# Patient Record
Sex: Female | Born: 1970 | Race: Black or African American | Hispanic: No | Marital: Single | Smoking: Never smoker
Health system: Southern US, Community
[De-identification: ages and names within clinical notes are randomized; demographics above are authoritative.]

## PROBLEM LIST (undated history)

## (undated) DIAGNOSIS — K5900 Constipation, unspecified: Secondary | ICD-10-CM

## (undated) DIAGNOSIS — E782 Mixed hyperlipidemia: Secondary | ICD-10-CM

## (undated) DIAGNOSIS — K429 Umbilical hernia without obstruction or gangrene: Secondary | ICD-10-CM

## (undated) DIAGNOSIS — D649 Anemia, unspecified: Secondary | ICD-10-CM

## (undated) DIAGNOSIS — R1900 Intra-abdominal and pelvic swelling, mass and lump, unspecified site: Secondary | ICD-10-CM

## (undated) DIAGNOSIS — R109 Unspecified abdominal pain: Secondary | ICD-10-CM

## (undated) HISTORY — DX: Constipation, unspecified: K59.00

## (undated) HISTORY — DX: Unspecified abdominal pain: R10.9

## (undated) HISTORY — DX: Anemia, unspecified: D64.9

## (undated) HISTORY — DX: Mixed hyperlipidemia: E78.2

## (undated) HISTORY — DX: Intra-abdominal and pelvic swelling, mass and lump, unspecified site: R19.00

## (undated) HISTORY — DX: Umbilical hernia without obstruction or gangrene: K42.9

---
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
## 1999-09-02 ENCOUNTER — Other Ambulatory Visit: Admission: RE | Admit: 1999-09-02 | Discharge: 1999-09-02 | Payer: Self-pay | Admitting: Obstetrics and Gynecology

## 1999-09-03 ENCOUNTER — Ambulatory Visit (HOSPITAL_COMMUNITY): Admission: RE | Admit: 1999-09-03 | Discharge: 1999-09-03 | Payer: Self-pay | Admitting: Obstetrics and Gynecology

## 1999-09-03 ENCOUNTER — Encounter: Payer: Self-pay | Admitting: Obstetrics and Gynecology

## 2000-06-15 HISTORY — PX: MYOMECTOMY ABDOMINAL APPROACH: SUR870

## 2000-11-10 ENCOUNTER — Other Ambulatory Visit: Admission: RE | Admit: 2000-11-10 | Discharge: 2000-11-10 | Payer: Self-pay | Admitting: Obstetrics and Gynecology

## 2000-12-22 ENCOUNTER — Inpatient Hospital Stay (HOSPITAL_COMMUNITY): Admission: RE | Admit: 2000-12-22 | Discharge: 2000-12-25 | Payer: Self-pay | Admitting: Obstetrics and Gynecology

## 2000-12-22 ENCOUNTER — Encounter (INDEPENDENT_AMBULATORY_CARE_PROVIDER_SITE_OTHER): Payer: Self-pay | Admitting: Specialist

## 2005-03-17 ENCOUNTER — Other Ambulatory Visit: Admission: RE | Admit: 2005-03-17 | Discharge: 2005-03-17 | Payer: Self-pay | Admitting: Obstetrics and Gynecology

## 2007-03-29 ENCOUNTER — Ambulatory Visit: Payer: Self-pay | Admitting: Cardiology

## 2007-04-05 ENCOUNTER — Encounter: Payer: Self-pay | Admitting: Cardiology

## 2007-04-05 ENCOUNTER — Ambulatory Visit: Payer: Self-pay

## 2007-05-31 ENCOUNTER — Inpatient Hospital Stay (HOSPITAL_COMMUNITY): Admission: RE | Admit: 2007-05-31 | Discharge: 2007-06-04 | Payer: Self-pay | Admitting: Obstetrics and Gynecology

## 2007-05-31 ENCOUNTER — Encounter (INDEPENDENT_AMBULATORY_CARE_PROVIDER_SITE_OTHER): Payer: Self-pay | Admitting: Obstetrics and Gynecology

## 2007-12-06 ENCOUNTER — Ambulatory Visit (HOSPITAL_COMMUNITY): Admission: RE | Admit: 2007-12-06 | Discharge: 2007-12-06 | Payer: Self-pay | Admitting: Obstetrics and Gynecology

## 2008-11-06 IMAGING — US US ABDOMEN COMPLETE
1 series · 14 of 25 positions shown · non-contrast
Comparison: No priors

CLINICAL DATA: Abdominal pain

ABDOMEN ULTRASOUND
TECHNIQUE: Complete abdominal ultrasound examination was performed
including evaluation of the liver, gallbladder, bile ducts,
pancreas, kidneys, spleen, IVC, and abdominal aorta.

[Series 1: unknown · 0.30mm/px · 14 of 59 slices shown]
[im 1/59]
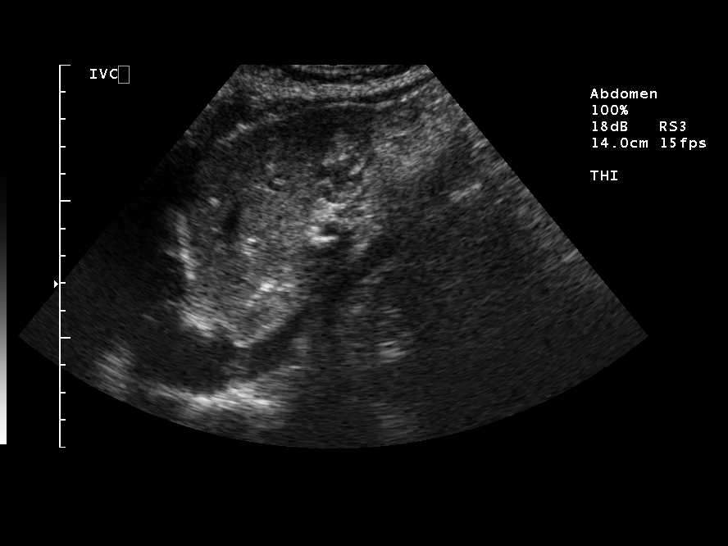
[im 5/59]
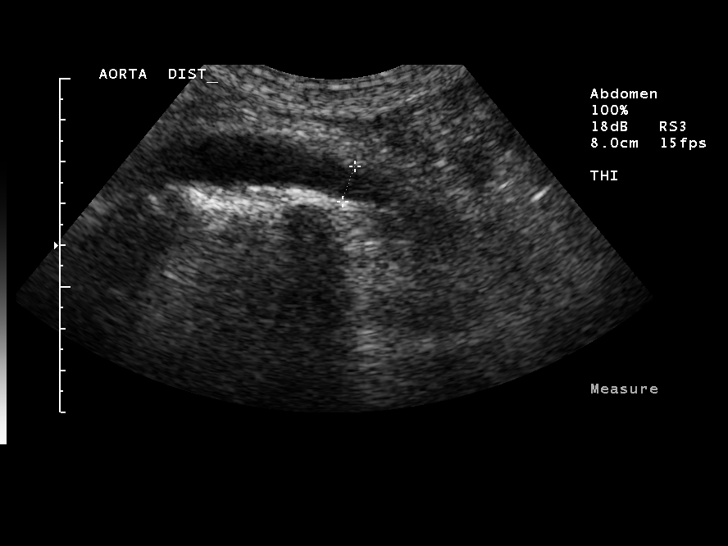
[im 10/59]
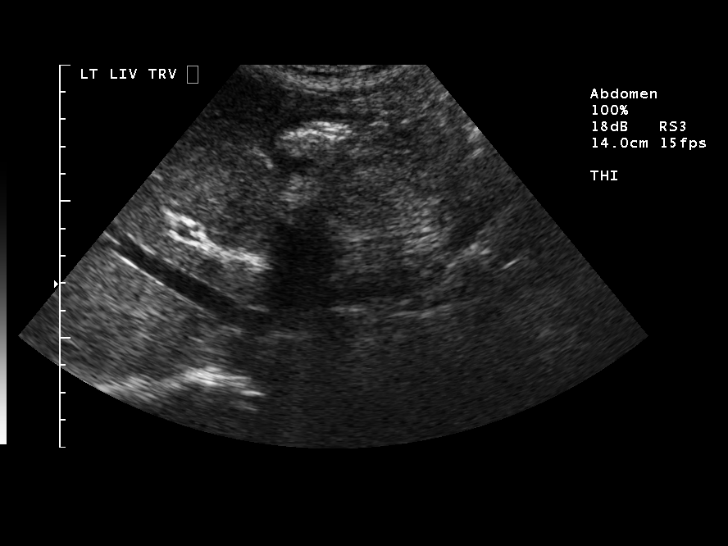
[im 15/59]
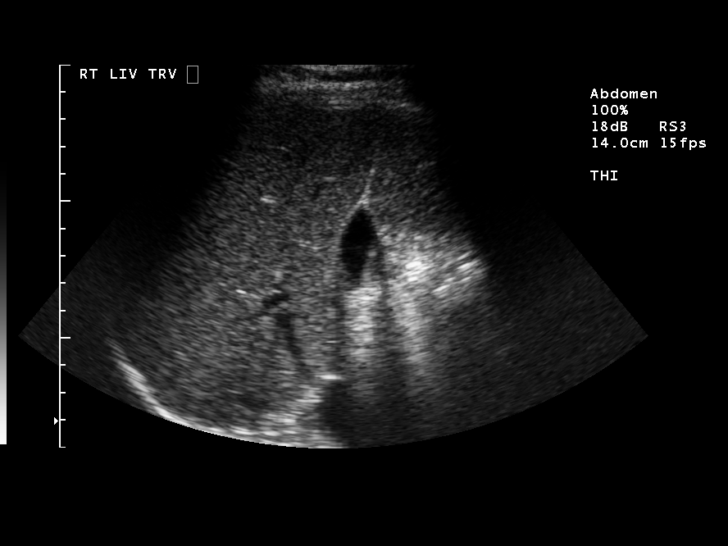
[im 20/59]
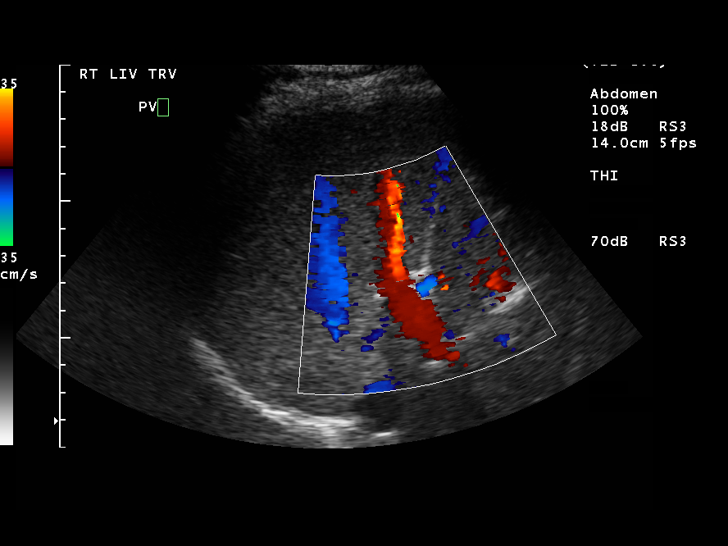
[im 22/59]
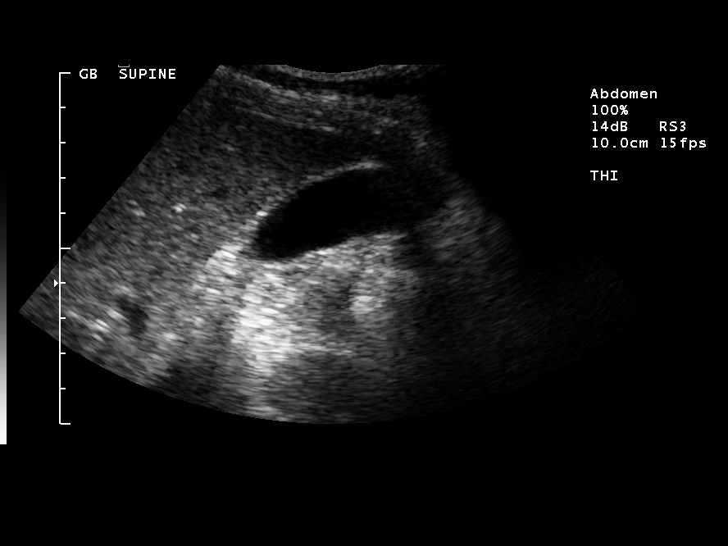
[im 27/59]
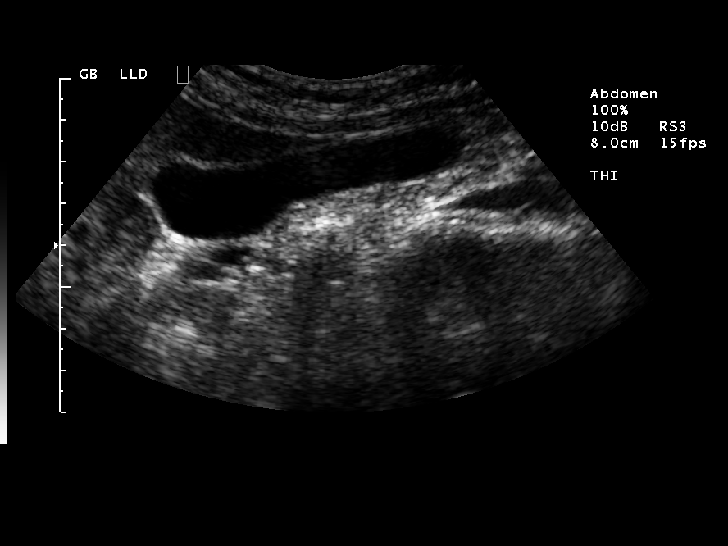
[im 32/59]
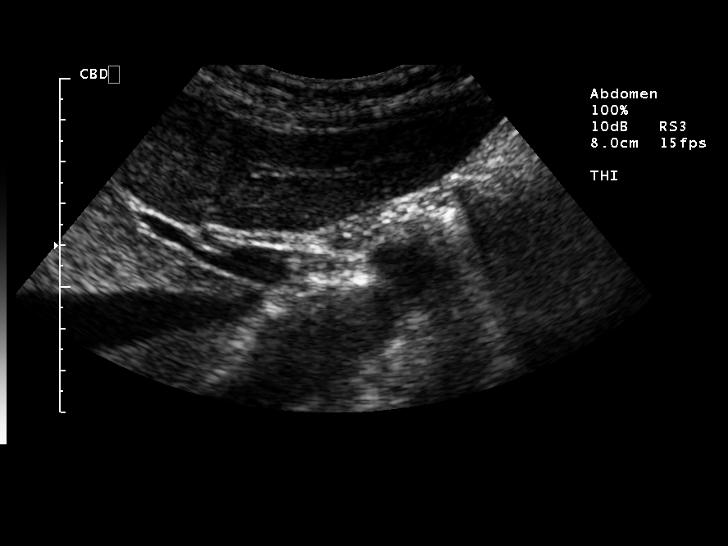
[im 37/59]
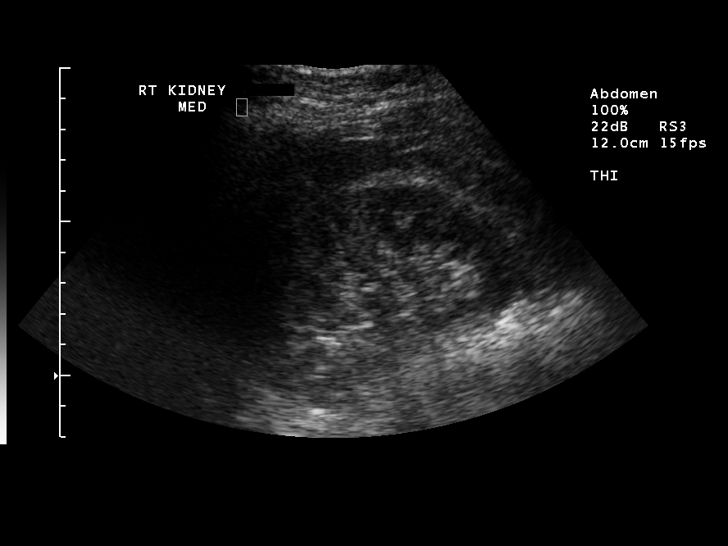
[im 39/59]
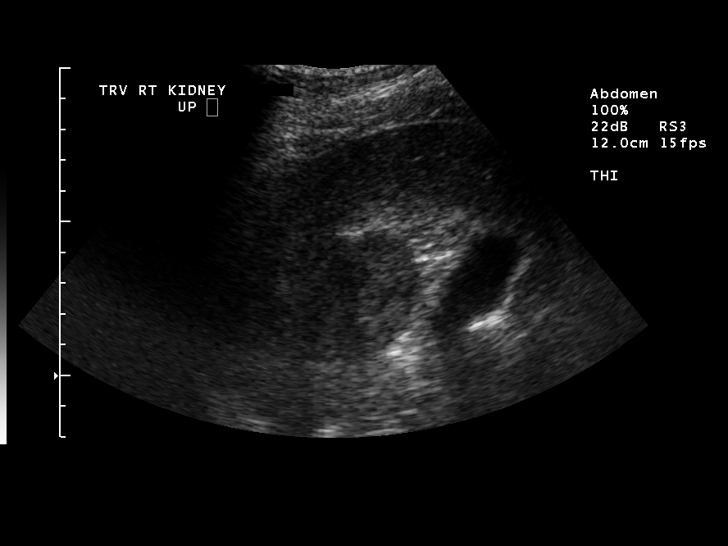
[im 44/59]
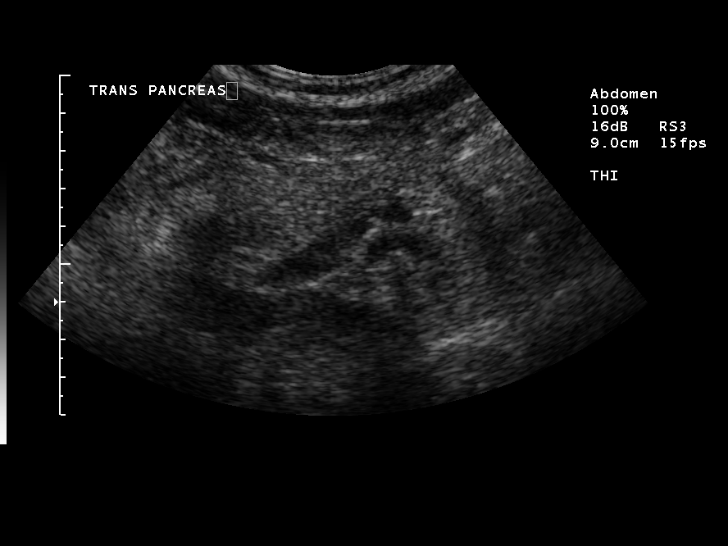
[im 49/59]
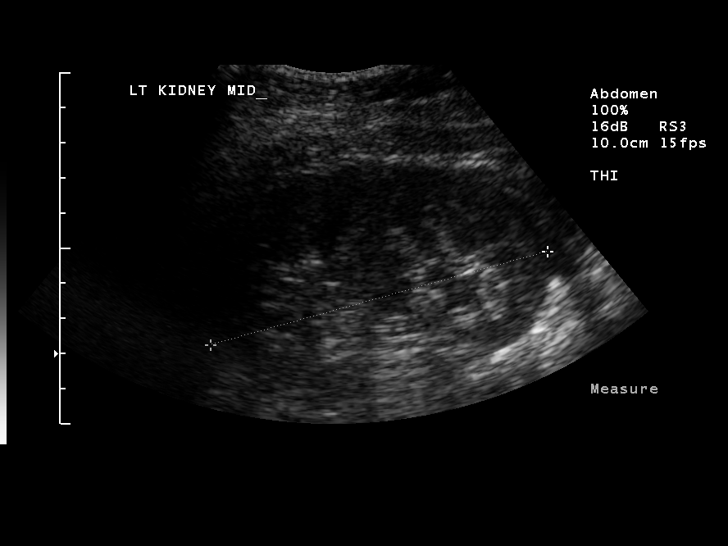
[im 54/59]
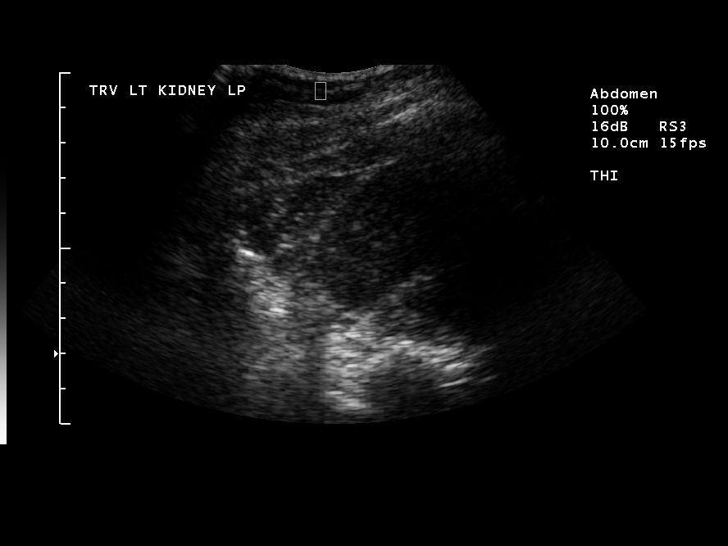
[im 59/59]
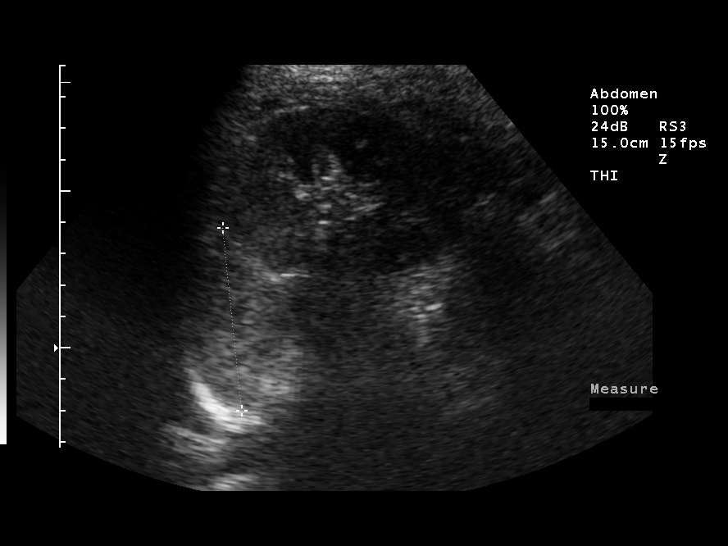

[14 of 25 positions shown; findings below may reference images not displayed]

FINDINGS: Gallbladder and bile ducts normal.  The common duct is
measured at 2.4 mm.

No focal lesions of the liver or spleen..  The spleen is relatively
small measuring only about 6 cm and CC dimension.

Kidneys, aorta, IVC, and pancreas normal.  No ascites.
IMPRESSION: 1.  No acute findings.
2.  The spleen is relatively small.

## 2009-12-08 ENCOUNTER — Emergency Department (HOSPITAL_COMMUNITY): Admission: EM | Admit: 2009-12-08 | Discharge: 2009-12-08 | Payer: Self-pay | Admitting: Emergency Medicine

## 2010-06-15 HISTORY — PX: HERNIA REPAIR: SHX51

## 2010-10-28 NOTE — Assessment & Plan Note (Signed)
[facility]                            CARDIOLOGY OFFICE NOTE   JALEIYAH, ALAS               MRN:          [id number]  DATE:03/29/2007                            DOB:          [birth year]    CHIEF COMPLAINT:  Almost blacking out spells.   HISTORY OF PRESENT ILLNESS:  Katelyn Lucas is a 40 year old single  black female who is currently about [redacted] weeks pregnant. She has had  several episode, one or two while driving [place] [address] to work, where she  has become pre-syncopal. These spells were highlighted by feeling  lightheaded, seeing spots and getting hot. She has not fainted  completely.   This has also happened on several occasions just sitting around the  house. She also notes that she gets lightheaded when she stands up too  quickly.   She has no history of syncope as a child or young adult.   PAST MEDICAL HISTORY:  She does not smoke, drink, use recreational  drugs.   ALLERGIES:  SULFA.   CURRENT MEDICATIONS:  Currently on no major medications other than  prenatal vitamins with iron supplement.   PAST SURGICAL HISTORY:  1. She has had previous breast surgery in 1991.  2. Fibrocystic surgery in 2002.   FAMILY HISTORY:  Negative for premature coronary disease or sudden  cardiac death.   SOCIAL HISTORY:  She is a Veterinary surgeon. She has to drive to [place]  area. She is single and has one other child.   REVIEW OF SYSTEMS:  Other than HPI is negative.   PHYSICAL EXAMINATION:  Blood pressure 120/75, pulse of 91 lying down.  Sitting up she dropped to 113/75 with a heart rate of 96. Standing she  had a blood pressure of 110/74 with a heart rate of 97. She felt dizzy  and felt pressure in her head. She did not faint.  HEENT: Normocephalic, atraumatic. PERRLA. Extra-ocular movements intact.  Sclerae are clear. Facial symmetry is normal.  NECK: Shows no JVD. Neck is supple. There is no dizziness with head  turning. Carotid upstrokes  are equal bilaterally without bruits. Thyroid  is not enlarged. Trachea is midline.  LUNGS:  Are clear.  HEART: Reveals a regular rate and rhythm. Normal S1, S2. There is no  gallop.  ABDOMEN: Gravida.  EXTREMITIES: Reveals no edema. Pulses are intact.  NEURO: Intact.   EKG is completely normal. She does have some T-wave inversion in V1 and  V2 which is within normal limits.   ASSESSMENT:  1. Pre-syncope/dizziness.  2. Orthostatic hypotension.   I have had a long talk with Shenise today. I have made the following  recommendations:  1. A 2D echocardiogram to rule out structural heart disease.  2. Stay well hydrated.  3. No prolonged driving greater than 30 minutes for fear of fainting      driving and hurting herself.  4. Orthostatic precautions reviewed at length including lying on her      left side before she gets out of bed and how to avoid a faint by      lying down. The key here is not injuring herself or  the child.   I would like to see her back a month or so after she has delivered. Will  check orthostatics at that time and reassess her clinically.     Thomas C. Daleen Squibb, MD, [facility]  Electronically Signed    TCW/MedQ  DD: 03/29/2007  DT: 03/30/2007  Job #: [id number]

## 2010-10-28 NOTE — H&P (Signed)
NAME:  Katelyn Lucas, Katelyn Lucas              ACCOUNT NO.:  [id number]   MEDICAL RECORD NO.:  [id number]          PATIENT TYPE:  INP   LOCATION:  NA                            [facility]   PHYSICIAN:  Malachi Pro. Ambrose Mantle, M.D. DATE OF BIRTH:  [birth year]   DATE OF ADMISSION:  05/31/2007  DATE OF DISCHARGE:                              HISTORY & PHYSICAL   PRESENT ILLNESS:  This is a 40 year old black female, para 0, gravida 1,  EDC June 06, 2007, by ultrasound, admitted for cesarean section  because of fibroids in the lower uterine segment and abnormal  presentation.  Blood group and type O positive,  Negative antibody.  Nonreactive  serology.  RPR nonreactive.  Sickle cell negative.  Rubella immune.  Hepatitis B surface antigen negative.  HIV negative.  GC and Chlamydia  negative.  Group B strep negative.  One-hour Glucola was normal.  The  patient's vaginal ultrasound on Oct 21, 2006, showed a crown-rump length  of 1.15-cm, 7 weeks and 2 days.  Repeat ultrasound on Nov 05, 2006, 9  weeks 4 days, [facility] June 06, 2007.  The patient declined first  trimester screening.  Her prenatal course was complicated by what she felt was near syncope  while driving to work.  She was evaluated by Dr. Juanito Doom, who felt that  she was disabled for the rest of her pregnancy from work because she  drove over 30 minutes to work each day.  A quad screen was negative.  Ultrasound on January 12, 2007, showed an average gestational age of [redacted]  weeks 4 days, [facility] of June 02, 2007.  The patient elected to proceed  with C-section because of her myomectomies that were done even though  the cavity was not entered and fibroids that were noted in the lower  uterine segment on ultrasound.  The episodes of lightheadedness began in  October 2008, and she was scheduled to see Dr. Daleen Squibb on October 14th.  He  wanted to see her after delivery to reassess her condition.  Because of  large for gestational age, the patient  underwent an ultrasound on  October 28th, that showed an amniotic fluid index of 21.44-cm consistent  with polyhydramnios.  Since then she has had reactive nonstress tests on  a weekly basis.  On May 24, 2007, I felt the baby might be transverse.  On May 30, 2007, an ultrasound showed that the baby was breech.   PAST MEDICAL HISTORY:  1. Reveals an allergy to:      a.     SULFA caused hives.      b.     METRONIDAZOLE caused her to black out.  2. She has had a history of Chlamydia in the past.  3. In 2002, she had multiple fibroids removed, the largest of which      was 11-cm.  4. In 1991, she had a left breast lumpectomy.  5. She has had trouble with a hiatal hernia.  6. 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   FAMILY HISTORY:  She does have high blood pressure on her mother's side  of the family.  No other significant history.   PHYSICAL EXAMINATION:  GENERAL:  A well-developed, well-nourished, black  female in no distress.  VITAL SIGNS:  Blood pressure 110/60, pulse of 80, weight 174 pounds.  HEENT:  Reveal no cranial abnormalities.  The nose and pharynx are  clear.  NECK:  Supple without thyromegaly.  LUNGS:  Clear to auscultation.  HEART:  Normal size and sounds.  No murmurs.  ABDOMEN:  Soft.  It has poor abdominal wall musculature causing the  abdominal wall to be quite lax.  Palpation of the lower abdomen reveals  there is a presenting part in the pelvis by ultrasound on May 30, 2007.  It showed that it was a breech.  PELVIC:  The cervix is closed.  By examination, in spite of the  ultrasound suggested breech, the examination shows something quite hard  in the pelvis.  This could be a fibroid, it could be the vertex, or it  could be the sacrum.   ADMITTING IMPRESSION:  1. Intrauterine pregnancy at 39 plus weeks.  2. Polyhydramnios.  3. Abnormal presentation.  4. History of multiple myomectomies.  5.  Currently the patient has fibroids in the lower uterine segment.   The patient is prepared for C-section.  I have studied her abdominal  wall.  I plan to make the incision through the old scar even though it  is a little lower on the abdominal wall than would be ideal.  The  patient is ready to proceed.      Malachi Pro. Ambrose Mantle, M.D.  Electronically Signed     TFH/MEDQ  D:  05/30/2007  T:  05/30/2007  Job:  [id number]

## 2010-10-28 NOTE — Op Note (Signed)
NAMEMarland Kitchen  Katelyn Lucas, Katelyn Lucas              ACCOUNT NO.:  [id number]   MEDICAL RECORD NO.:  [id number]          PATIENT TYPE:  INP   LOCATION:  9102                          [facility]ACILITY:  [facility]   PHYSICIAN:  Malachi Pro. Ambrose Mantle, M.D. DATE OF BIRTH:  10/11/1970   DATE OF PROCEDURE:  05/31/2007  DATE OF DISCHARGE:                               OPERATIVE REPORT   PREOPERATIVE DIAGNOSIS:  Intrauterine pregnancy at 39+ weeks, prior  myomectomies, unstable lie.   POSTOPERATIVE DIAGNOSIS:  Intrauterine pregnancy at 39+ weeks, prior  myomectomies, unstable lie, with marked levorotation of the uterus,  fibroids, oblique lie, adhesions of the sigmoid to the fundus of the  uterus.   OPERATION:  Low-transverse cervical cesarean section, division of  adhesions.   OPERATOR:  Malachi Pro. Ambrose Mantle, M.D.   ASSISTANT:  Zenaida Niece, M.D.   ANESTHESIA:  Spinal anesthesia.   The patient was brought to the operating room and given a spinal  anesthetic and placed in left lateral position.  The fetal heart tones  were normal.  The patient was prepared for the C-section because she did  have previous myomectomies and one of them was 11 cm in diameter.  Even  though the cavity was not entered it was close to the cavity and the  patient preferred to go with a C-section.  Also in the last week the  fetal presentation seemed to be transverse.  Then on ultrasound on 12:15  it was breech and on physical exam later that day it seemed to be  vertex.  The abdomen was prepped with Betadine solution and draped as a  sterile field.  The urethra was prepped and a Foley catheter was  inserted to drainage.  The old transverse incision was deemed too low to  provide access to the presentation because I had examined the patient  under spinal anesthesia and could find no presenting part in the pelvis.  I made the incision about 2 inches above the old incision carried in  layers through the skin and subcutaneous tissue and fascia.   The fascia  was then separated from the rectus muscles superiorly and inferiorly and  the rectus muscle was already split in the midline.  The peritoneum was  opened vertically.  Immediately under the incision was the large  varicosities of the right broad ligament, confirming that the uterus was  markedly levorotated. I found what I thought was the vertex basically in  the right lower quadrant of the abdomen so even though it was delivered  as vertex, it was not a vertex presentation.  It was more of an oblique  presentation.  I made an incision into the lower uterine segment  myometrium  by pushing the vertex immediately over the pelvis then I  entered the amniotic sac which delivered clear fluid with the [place]  clamps.  I enlarged the incision by blunt dissection pulling laterally  in both directions and with fundal pressure was able to deliver the  vertex through the incisional opening.  After delivering the vertex, the  nose and pharynx was suctioned with the bulb.  The  remainder of the baby  was delivered, the cord was clamped and given to Dr. Francine Graven  who was  in attendance and she assigned the 6 pounds 9 ounces female infant Apgars  of 8 at one and 9 at five minutes.  I preserved blood for cord blood  studies and then I prepared the cord with Betadine and used 60 mL  syringes to draw as much cord blood as I could to preserve for her cord  blood registry.  I then delivered the placenta out of the uterus.  Tried  to use a ring forceps to dilate the cervix but it would not go through  the cervix.  I used a Kelly clamp for that purpose, inspected the inside  of the uterus found it to be free of debris and then I closed the uterus  with two running sutures of 0 Vicryl, locking the first layer,  nonlocking on the second layer.  After the closure was complete, I  inspected the uterus and found it to have a large number of adhesions  binding the colon to the fundus of the uterus.  I  dissected these with  the Bovie so that the colon was no longer attached to the uterus.  After  the adhesions were divided I inspected the uterus.  There was probably a  5-6 cm fibroid in the fundus of the uterus and then the right tube and  ovary were inspected and found to be normal.  The left tube appeared  normal but the left ovary was not visible and I think that peritoneum  had encased the ovary but I did not see any entry to the ovary.  I  palpated.  I thought I could feel it but it was not visible.  The  gutters were blotted free of blood.  Liberal irrigation confirmed  hemostasis and then the abdominal wall was closed with interrupted  sutures of 0 Vicryl including the rectus muscle and the peritoneum.  The  fascia was closed with two running sutures of 0 Vicryl.  The subcu with  a running 3-0 Vicryl.  Skin was closed with automatic staples.  The  patient seemed to tolerate the procedure well.  Blood loss was estimated  about 800 mL.  Sponge and needle counts were correct and she was  returned to recovery in satisfactory condition.      Malachi Pro. Ambrose Mantle, M.D.  Electronically Signed     TFH/MEDQ  D:  05/31/2007  T:  06/01/2007  Job:  [id number]

## 2010-10-31 NOTE — H&P (Signed)
[facility]  Patient:    Katelyn Lucas, Katelyn Lucas [facility]               MRN: [id number] Adm. Date:  12/22/00 Attending:  Malachi Pro. Ambrose Mantle, M.D.                         History and Physical  PRESENT ILLNESS:  This is a 40 year old black female, para 0, admitted to the hospital for myomectomies because of enlarging fibroids.  This patient was seen initially on November 27, 1992 with a 1-cm fibroid on her right uterine fundus.  She has been followed since.  Her last menstrual period was December 06, 2000.  Her periods are unknown as far as their severity and pain without the birth control pill because she has been on pills until the last month.  On the pills, her periods were every 28 days, lasting 5 days with variable flow.  As stated, in 1994, the fibroid was 1 cm.  She was seen on almost yearly intervals since then and again in 1997, the fibroid seemed to be about the same size.  In 2001, the fibroid seemed to have enlarged to 6 to 8 cm and an ultrasound was done September 03, 1999 that showed several uterine fibroids, the largest of which was 3.5 x 3.6 cm and another measured 3.4 x 2.5 cm.  At that time, the uterus was not palpable abdominally.  When I saw her in May of 2002, there was a question of a lower abdominal fullness and there again seemed to be a large fibroid anterior on the uterus I thought about 6 to 8 cm.  The patient has some lower abdominal fullness, she has pressure sensation and has urinary frequency, all attributable probably to the fibroids.  She is admitted now for myomectomies.  The patient has had no sexual intercourse since her last menstrual period.  ALLERGIES:  METRONIDAZOLE and SULFA.  MEDICATIONS:  She does not take any medications at present.  PAST MEDICAL HISTORY:  She has had the usual childhood diseases.  No heart problems.  SOCIAL HISTORY:  No alcohol, tobacco or drugs.  REVIEW OF SYSTEMS:  Negative.  OPERATIONS:  Right breast biopsy  in 1991.  FAMILY HISTORY:  Mother 9, living and well.  Father 40, living and well.  No sisters.  One brother living and well.  PHYSICAL EXAMINATION:  GENERAL:  Physical exam reveals a well-developed, well-nourished black female. Weight is 134 pounds.  VITAL SIGNS:  Pulse 80, blood pressure 130/70.  HEENT:  No cranial abnormalities.  Extraocular movements are intact.  Nose and pharynx clear.  NECK:  Supple without thyromegaly.  HEART:  Normal size and sounds.  No murmurs.  LUNGS:  Clear to P&A.  BREASTS:  Soft without masses.  ABDOMEN:  Liver, spleen and kidney are not felt.  There are no masses other than the fundal height measures 7 cm above the pubis.  PELVIC:  Pap smear on Nov 10, 2000 was within normal limits.  The vulva and vagina are clear.  Cervix clean.  Uterus is irregular.  There seems to be a 6- to 7-cm fibroid on the right anterior uterine fundus.  There may be other fibroids.  The adnexa are clear.  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  The patient is admitted for multiple myomectomies.  She understands the risks of surgery include heart attack, stroke, pulmonary embolus, wound disruption, hemorrhage with need for reoperation and/or transfusion,  intestinal obstruction, nerve injury.  She also understands that there is a remote possibility the uterus would have to be removed.  She understands and agrees to proceed. DD:  12/22/00 TD:  12/22/00 Job: [id number]

## 2010-10-31 NOTE — Discharge Summary (Signed)
[facility]  Patient:    Katelyn Lucas, Katelyn Lucas               MRN: [id number] Adm. Date:  [id number] Disc. Date: [id number] Attending:  Malon Kindle                           Discharge Summary  NO DICTATION DD:  01/06/01 TD:  01/06/01 Job: [id number]

## 2010-10-31 NOTE — Discharge Summary (Signed)
NAMEMarland Lucas  MARTHELLA, OSORNO              ACCOUNT NO.:  [id number]   MEDICAL RECORD NO.:  [id number]          PATIENT TYPE:  INP   LOCATION:  9102                          [facility]   PHYSICIAN:  Huel Cote, M.D. DATE OF BIRTH:  06/18/1970   DATE OF ADMISSION:  05/31/2007  DATE OF DISCHARGE:  06/04/2007                               DISCHARGE SUMMARY   DISCHARGE DIAGNOSES:  1. Term intrauterine pregnancy at 39+ weeks, delivered, by a scheduled      C-section secondary to prior myomectomy.  2. Marked rotation of the uterus to the patient's right, secondary to      the fibroids, and an oblique lie of the baby.  3. Adhesions of the sigmoid to the fundus, requiring lysis of      adhesions, at time of C-section   DISCHARGE MEDICATIONS:  1. Motrin 600 mg p.o. q.6 h.  2. Percocet 1-2 tablets p.o. q.4 h. p.r.n.   DISCHARGE FOLLOWUP:  The patient is to follow up in 2 weeks for an  incision check.   HOSPITAL COURSE:  The patient is a 40 year old G1 P0 who came in for a  scheduled cesarean section given the history of previous myomectomy,  felt to be incompatible with a trial of labor.  She underwent a primary  low transverse C-section, lysis of adhesions, and was delivered of a  viable female infant. 6 pounds 9 ounces, Apgars were 8 and 9.  Secondary  to her adhesions, the left tube and ovary were not visible; but the  right tube and ovary appeared normal.  Estimated blood loss a the time  of C-section was a 800 mL, and she was admitted for routine post  operative care.   She did well and recovered, and on postoperative day #4 was doing fine  without complaints.  She was ready for discharge, tolerating a regular  diet, with her pain well controlled.  Her incision was clear.  Staples  removed, and Steri-Strips placed, and she was discharged home with  prescriptions and follow up as previously stated.      Huel Cote, M.D.  Electronically Signed     KR/MEDQ  D:   06/27/2007  T:  06/28/2007  Job:  [id number]

## 2010-10-31 NOTE — Discharge Summary (Signed)
[facility]  Patient:    Katelyn Lucas, Katelyn Lucas               MRN: [id number] Adm. Date:  [id number] Disc. Date: [id number] Attending:  Malon Kindle                           Discharge Summary  HISTORY OF PRESENT ILLNESS:  This is a 40 year old black female with large symptomatic fibroids, admitted for myomectomy.  She underwent multiple myomectomies by Malachi Pro. Ambrose Mantle, M.D., with Zenaida Niece, M.D., assisting under general anesthesia on the day of admission.  HOSPITAL COURSE:  Postoperatively, she did quite well.  Initially she did become afebrile to 100.4 degrees.  Her abdomen was soft and slightly tender. She had no dysuria.  She did have some slight sore throat.  She was begun on doxycycline, but after the first dose, she vomited.  It was felt to be related to the doxycycline, so it was discontinued and no other medication was started.  On the third postoperative day, she was doing well.  Her abdomen was soft and nontender.  She was felt by Zenaida Niece, M.D., to be a candidate for discharge.  LABORATORY DATA:  The pathology report showed leiomyomata.  There were four nodular, rubbery to focally firm masses which ranged from 11.3 cm in greatest dimension to 0.6 cm and had a combined weight of 483 g.  The initial white count was 6200, hemoglobin 11.9, hematocrit 36.0, and platelet count 370,000. There were 37 neutrophils, 55 lymphs, 7 monos, 1 eosinophil, and 1 basophil. The comprehensive metabolic panel was normal, except for a chloride of 113. The urinalysis showed 3-6 white cells and 0-2 red cells.  The urine culture for evaluation of the fever was no growth.  DISPOSITION:  At the time of discharge, the patient was afebrile, her incision was well approximated, and she was discharged home.  FINAL DIAGNOSIS:  Leiomyomata uteri.  OPERATIONS:  Multiple myomectomies.  FINAL CONDITION:  Improved.  ACTIVITY:  No vaginal  entrance.  No heavy lifting or strenuous activity.  FOLLOW-UP:  The patient was advised to return to the office in 10-14 days.  DISCHARGE MEDICATIONS:  Take Percocet one or two every four to six hours as needed for pain. DD:  01/06/01 TD:  01/06/01 Job: [id number]

## 2010-10-31 NOTE — Op Note (Signed)
[facility]  Patient:    Katelyn Lucas, Katelyn Lucas                MRN: [id number] Proc. Date: 12/22/00 Attending:  Malachi Pro. Ambrose Mantle, M.D.                           Operative Report  PREOPERATIVE DIAGNOSIS:  Large fibroid symptomatic.  POSTOPERATIVE DIAGNOSIS:  Large fibroid symptomatic.  OPERATION PERFORMED:  Multiple myomectomies.  SURGEON:  Dr. Ambrose Mantle.  ASSISTANT:  Dr. Jackelyn Knife.  ANESTHESIA:  General.  DESCRIPTION OF PROCEDURE:  The patient was brought to the operating room, placed under satisfactory general anesthesia and was placed in frog leg position. The abdomen, vulva, and urethra were prepped with Betadine solution. A Foley catheter was inserted to straight drain. The patient was placed supine, the abdomen was draped as a sterile field, and a transverse incision was made suprapubically and carried in layers through the skin, subcutaneous tissue and fascia. The fascia was then separated from the rectus muscle superiorly and inferiorly. The rectus muscle was split in the midline, also cut and the peritoneum was opened vertically. I did not explore the upper abdomen because it was a small incision. The fibroid uterus was right under the incision. I was able to elevate the fibroid through the incisional opening and the primary fibroid was about 9 x 6 x 6 cm. This was attached to the fundus of the uterus closer to the right tube than to the left. I injected it with Pitressin and then incised and then gradually with the aid of sharp, blunt and electrical dissection was able to remove this fibroid intact. It was a very irregular fibroid opened all in one piece. It was close to the right tube but I did not touch the right tube. Both tubes and ovaries appeared normal. There was a simple appearing cyst on the left ovary. There were three other smaller fibroids none of which was over 1 cm in diameter, all of them were removed and the bases were treated  with 3-0 Vicryl suture. Redundant myometrial serosa was removed over the very large fibroid and then the myometrium was reapproximated after hemostasis was obtained with deep 2-0 and 3-0 Vicryl sutures. I then reapproximated the 3-0 Vicryl. There was one other fibroid very low down on the uterus that you could not even see but you could palpate it very low posteriorly on the posterior lower uterine segment close to the uterosacral ligament. I elected to leave this. Several additional sutures were required to get complete hemostasis. At this point, the uterus appeared very normal in size and shape, in fact, it was quite small. There was on bleeding. All packs were removed and the abdominal wall was closed with a running suture of #0 Vicryl on the peritoneum, interrupted #0 Vicryl on the rectus muscle and two running sutures of #0 Vicryl on the fascia, a running 3-0 Vicryl in the subcutaneous tissue and staples on the skin. The patient seemed to tolerate the procedure well. Blood loss was estimated at 200 cc. Sponge and needle counts were correct. She was returned to recovery in satisfactory condition. DD:  12/22/00 TD:  12/22/00 Job: [id number]

## 2011-03-20 LAB — COMPREHENSIVE METABOLIC PANEL
ALT: 18
AST: 24
Alkaline Phosphatase: 122 — ABNORMAL HIGH
CO2: 21
Calcium: 9.1
GFR calc Af Amer: >60
GFR calc non Af Amer: >60
Potassium: 3.9
Sodium: 137

## 2011-03-20 LAB — DIFFERENTIAL
Eosinophils Absolute: 0 — ABNORMAL LOW
Eosinophils Relative: 0
Lymphs Abs: 2
Monocytes Relative: 6

## 2011-03-20 LAB — CBC
HCT: 28.8 — ABNORMAL LOW
HCT: 30.4 — ABNORMAL LOW
Hemoglobin: 10.3 — ABNORMAL LOW
MCHC: 33.8
MCV: 86
MCV: 86.2
Platelets: 218
Platelets: 226
RBC: 3.53 — ABNORMAL LOW
RDW: 16.9 — ABNORMAL HIGH
RDW: 17 — ABNORMAL HIGH
WBC: 7.2

## 2011-03-20 LAB — RPR: RPR Ser Ql: NONREACTIVE

## 2015-09-30 DIAGNOSIS — K5900 Constipation, unspecified: Secondary | ICD-10-CM | POA: Insufficient documentation

## 2015-09-30 DIAGNOSIS — R1013 Epigastric pain: Secondary | ICD-10-CM | POA: Insufficient documentation

## 2015-09-30 DIAGNOSIS — K429 Umbilical hernia without obstruction or gangrene: Secondary | ICD-10-CM | POA: Insufficient documentation

## 2015-09-30 DIAGNOSIS — R110 Nausea: Secondary | ICD-10-CM | POA: Insufficient documentation

## 2019-12-21 DIAGNOSIS — D649 Anemia, unspecified: Secondary | ICD-10-CM | POA: Insufficient documentation

## 2020-01-09 ENCOUNTER — Other Ambulatory Visit: Payer: Self-pay

## 2020-01-09 ENCOUNTER — Encounter: Payer: Self-pay | Admitting: Physical Therapy

## 2020-01-09 ENCOUNTER — Ambulatory Visit: Payer: Medicaid Other | Attending: Physician Assistant | Admitting: Physical Therapy

## 2020-01-09 DIAGNOSIS — M6281 Muscle weakness (generalized): Secondary | ICD-10-CM | POA: Diagnosis present

## 2020-01-09 NOTE — Therapy (Signed)
[place], [place], [address] Phone: [phone]   Fax:  [phone]  Physical Therapy Evaluation  Patient Details  Name: Katelyn Lucas MRN: [id number] Date of Birth: 1970-09-18 Referring Provider (PT): Sue Lush, [place]   Encounter Date: 01/09/2020   PT End of Session - 01/09/20 1245    Visit Number 1    Number of Visits 1    Date for PT Re-Evaluation 01/10/20    Authorization Type MCD    PT Start Time 5885    PT Stop Time 1232    PT Time Calculation (min) 47 min    Activity Tolerance Patient tolerated treatment well    Behavior During Therapy [facility] for tasks assessed/performed           Past Medical History:  Diagnosis Date  . Anemia     Past Surgical History:  Procedure Laterality Date  . BREAST SURGERY  1991   lump removal  . CESAREAN SECTION  2008  . HERNIA REPAIR  2012  . ROBOTIC ASSISTED LAPAROSCOPIC VAGINAL HYSTERECTOMY WITH FIBROID REMOVAL  2005    There were no vitals filed for this visit.    Subjective Assessment - 01/09/20 1156    Subjective pt is a 49 y.o F with CC of abdominal pain. She has hx of hernia repair and reports the symptoms started int he upper abdominal quadrant with radition of pressure to the L and fluid accumulation. She report since she started miralax the pain has gotten better.  she denies any back or UE/LE pain.    How long can you sit comfortably? unlimited    How long can you stand comfortably? unlimited    How long can you walk comfortably? unlimited    Currently in Pain? 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  No    Is the patient reluctant to leave their home because of a fear of falling?  No      Home Ecologist residence      Prior Function   Level of Independence Independent    Vocation Unemployed;Student   masters IT      Cognition   Overall Cognitive Status Within Functional Limits for tasks assessed      Palpation   Palpation comment TTP along the L obliques along the descending colon  reproducig concordant pressure symptoms, Diastasis recti noted with no specific tenderness noted.                      Objective measurements completed on examination: See above findings.               PT Education - 01/09/20 1244    Education Details evaluation findings, progress HEP, anatomy of the abdominals.    Person(s) Educated Patient    Methods Explanation;Verbal cues;Handout    Comprehension Verbalized understanding;Verbal cues required                       Plan - 01/09/20 1248    Clinical Impression Statement pt presents to OPPT with CC of abdominal soreness/ pressure starting after her hernia repair in 2012 . She has functional trunk ROM and LE strength. Some weakness noted during abdominal strength assessment but no pain noted during MMT. PT noted concordant pressure symptoms with L upper/ lower quadrant palpation along the descending colon which. She noted she has gotten signficiant relief of symptoms since she started miralax.provided  comprehenisve abdminal strengtheing program which she was able to perform a few of the exericses without complaint  Based on subjective findings and palpation pt's CC appears to be non-muscular in nature and isn't appropriate for PT at this time.    Personal Factors and Comorbidities --    Comorbidities --    Stability/Clinical Decision Making Stable/Uncomplicated    Clinical Decision Making Low    PT Frequency One time visit    PT Next Visit Plan one time visit    PT [place] and Agree with Plan of Care Patient           Patient will benefit from skilled therapeutic intervention in order to improve the following deficits and impairments:  Postural dysfunction  Visit Diagnosis: Muscle weakness (generalized)     Problem List There are no problems to display for this patient.  Starr Lake PT, DPT, LAT, ATC  01/09/20  12:55 PM      [facility] [address] [place], [place], [address] Phone: [phone]   Fax:  [phone]  Name: Katelyn Lucas MRN: [id number] Date of Birth: 15-Dec-1970

## 2020-01-18 DIAGNOSIS — E782 Mixed hyperlipidemia: Secondary | ICD-10-CM | POA: Insufficient documentation

## 2020-01-19 ENCOUNTER — Telehealth: Payer: Self-pay | Admitting: *Deleted

## 2020-01-19 DIAGNOSIS — D259 Leiomyoma of uterus, unspecified: Secondary | ICD-10-CM | POA: Insufficient documentation

## 2020-01-19 DIAGNOSIS — R9389 Abnormal findings on diagnostic imaging of other specified body structures: Secondary | ICD-10-CM | POA: Insufficient documentation

## 2020-01-19 NOTE — Telephone Encounter (Signed)
Called and left the patient a message to call the office back. Patient needs to be scheduled for a new patient appt

## 2020-01-19 NOTE — Telephone Encounter (Signed)
Gave Katelyn Lucas an appointment to see Dr.Tucker on Tuesday 01-23-20 at 1100 with arrival at 1030. Reviewed new patient process. Pt verbalized understanding.

## 2020-01-23 ENCOUNTER — Other Ambulatory Visit: Payer: Self-pay

## 2020-01-23 ENCOUNTER — Encounter: Payer: Self-pay | Admitting: Gynecologic Oncology

## 2020-01-23 ENCOUNTER — Inpatient Hospital Stay: Payer: Medicaid Other | Attending: Gynecologic Oncology

## 2020-01-23 ENCOUNTER — Inpatient Hospital Stay (HOSPITAL_BASED_OUTPATIENT_CLINIC_OR_DEPARTMENT_OTHER): Payer: Medicaid Other | Admitting: Gynecologic Oncology

## 2020-01-23 ENCOUNTER — Ambulatory Visit
Admission: RE | Admit: 2020-01-23 | Discharge: 2020-01-23 | Disposition: A | Discharge status: Home / Self Care | Payer: Self-pay | Source: Ambulatory Visit | Attending: Gynecologic Oncology | Admitting: Gynecologic Oncology

## 2020-01-23 VITALS — BP 161/89 | HR 80 | Temp 98.5°F | Resp 16 | Ht 61.5 in | Wt 168.4 lb

## 2020-01-23 DIAGNOSIS — D398 Neoplasm of uncertain behavior of other specified female genital organs: Secondary | ICD-10-CM

## 2020-01-23 DIAGNOSIS — N9489 Other specified conditions associated with female genital organs and menstrual cycle: Secondary | ICD-10-CM

## 2020-01-23 DIAGNOSIS — D259 Leiomyoma of uterus, unspecified: Secondary | ICD-10-CM | POA: Insufficient documentation

## 2020-01-23 DIAGNOSIS — Z79899 Other long term (current) drug therapy: Secondary | ICD-10-CM | POA: Diagnosis not present

## 2020-01-23 DIAGNOSIS — R109 Unspecified abdominal pain: Secondary | ICD-10-CM

## 2020-01-23 DIAGNOSIS — K5900 Constipation, unspecified: Secondary | ICD-10-CM | POA: Diagnosis not present

## 2020-01-23 DIAGNOSIS — E782 Mixed hyperlipidemia: Secondary | ICD-10-CM | POA: Diagnosis not present

## 2020-01-23 DIAGNOSIS — R6881 Early satiety: Secondary | ICD-10-CM | POA: Insufficient documentation

## 2020-01-23 LAB — CEA (IN HOUSE-CHCC): CEA (CHCC-In House): 1.14 ng/mL (ref 0.00–5.00)

## 2020-01-23 NOTE — Patient Instructions (Signed)
It was a pleasure meeting you today!  I have scheduled a follow-up for Katelyn Lucas to have by phone in 3 months, after your repeat ultrasound.  If you have any trouble getting ultrasound scheduled with the same place that you had your last one, please call the clinic here at [phone], and we can have it scheduled through [facility].  If you find out from Dr. Arvin Collard that the recommendation is for surgery in the setting of your hernia, please call me so that I can help ensure that somebody is involved during your surgery to take a look at that left ovary.

## 2020-01-23 NOTE — Progress Notes (Addendum)
GYNECOLOGIC ONCOLOGY NEW PATIENT CONSULTATION   Patient Name: Katelyn Lucas  Patient Age: 49 y.o. Date of Service: 01/23/20 Referring Provider: Tereasa Coop, PA-C  Primary Care Provider: System, Provider Not In Consulting Provider: Jeral Pinch, MD   Assessment/Plan:  Premenopausal patient with a complex surgical history and now new finding of a complex adnexal mass.  The patient notes abdominal symptoms for the last year or so.  These seem related to her GI function and have improved significantly since working on constipation and increasing her water intake.  While her fibroid uterus and the adnexal mass may be contributing some to her symptoms, my suspicion is that they are mostly related to her prior surgical history and GI tract.  We reviewed in detail her prior surgeries, notably that she had a large mesh placed for repair of her umbilical hernia and now has what appears to be a recurrent small umbilical hernia as well as significant laxity versus diastases of her rectus muscles.  She overall is not very symptomatic from this and has met recently again with a general surgeon.  She had initially seen somebody back in 2017, but did not follow-up due to changing jobs and her insurance status.  We discussed possible etiologies for the low left-sided adnexal mass which include ovarian, fallopian tube, or adhesions from prior surgeries causing peritoneal cysts.  Why do not have access to her imaging, we will work to get the CT scan images power shared.  Overall, based on the description and the report, the mass sounds mostly cystic with some internal septations.  My overall suspicion that this is a malignancy is low.  I reviewed the role of getting tumor markers today.  Specifically, with regard to CA-125, we discussed that this is neither a sensitive nor specific test.  The patient understands that it may be elevated in other benign disease processes, some of which she has like fibroids,  and can be normal and up to 30-50% of patients with early stage ovarian cancer.  Reassuring is the fact that no evidence of metastatic disease was seen on her recent CT scan.  My recommendation, given the fact that her abdominal symptoms have much improved with improvement in her constipation, is that we repeat imaging in approximately 3 months to see if there has been any interval increase in size or change in the appearance of this left adnexal mass.  I also would like her to let me know what her follow-up conversation with the general surgeon is.  If the plan were for surgical intervention to repair her recurrent hernia, my suggestion would be that either an OB/GYN or a GYN oncologist be available to assess and potentially remove that left adnexa.  We also discussed the possibility of hormonal suppression of her ovaries, which could also treat her uterine fibroids.  Specifically, we discussed the use of Depo-Lupron.  This can be something we discussed after her follow-up ultrasound depending on her symptomatology and any plans for OR intervention from a hernia standpoint.  All the patient's questions were answered today. A copy of this note was sent to the patient's referring provider.  I think it is more useful to have the ultrasound repeated at the same facility where it initially was.  I have asked the patient to call me if she has any issues getting a repeat ultrasound performed and we will plan to do it here at [facility].  60 minutes of total time was spent for this patient encounter, including  preparation, face-to-face counseling with the patient and coordination of care, and documentation of the encounter.   Jeral Pinch, MD  Division of Gynecologic Oncology  Department of Obstetrics and Gynecology  [facility]  ___________________________________________  Chief Complaint: Chief Complaint  Patient presents with  . Adnexal mass    New Patient    History  of Present Illness:  Katelyn Lucas is a 49 y.o. y.o. female who is seen in consultation at the request of Tereasa Coop PA for an evaluation of complex adnexal mass.  Patient presents today after undergoing a CT scan in the setting of abdominal symptoms that incidentally found a complex left adnexal mass.  Patient has a long history of known fibroid uterus.  She notes that some point last year, she developed constipation, abdominal bloating, and water retention, more on the left upper and mid abdomen than the right.  She has had periods of constipation in the past since her hernia surgery but describes herself as not being very symptomatic from the constipation and that it would get better with medication.  She notes that her "digestive system has seemed different" since her umbilical hernia surgery.  Given her continued symptoms, she recently saw her primary care provider.  She has been watching her diet more closely now and for last 2 months has been drinking 64 ounces of water a day.  She and her son as well as her mother (with whom she lives) are getting ready to go completely vegan.  She notices that she has occasional early satiety that seems dependent on food intake, in particular meat and starch items.  After seeing her PCP in July, she started taking MiraLAX which she is taking twice a day.  She is now having bowel movements up to 3 times a day.  Since the oral contrast for the CT scan last week, she has not needed MiraLAX and continues to have normal bowel function.  She also feels significantly less bloated.  She is also seen Dr. Arvin Collard, a general surgeon in [place].  Plan was for her to at least have a phone call with him after her CT scan results were back.  In terms of her GYN history, the patient reports regular cycles and denies any intermenstrual bleeding.  Her last period was several weeks ago and she had about 24 hours of excruciating pain on the left side that felt as if  "someone took a knife to that area".  After the pain subsided, she had several days of brown discharge which has since stopped.  Her surgical history is notable for abdominal myomectomies for a large symptomatic fibroid uterus performed in 2002.  A 9 x 6 x 6 cm fibroid was removed at this time.  In 2008, the patient underwent a low transverse cesarean section.  At the time of that surgery, adhesions were noted between the colon and the uterine fundus.  The right fallopian tube and ovary were normal in appearance.  The left tube was normal in appearance but a left ovary was not visible and thought to be retroperitoneal.  She then underwent an umbilical hernia repair with mesh (15 x 20 cm) and 2012 in [place].  She saw a general surgeon in 2016 at [facility] due to persistent upper abdominal bloating, soreness and nausea.  CT scan of the abdomen pelvis in March 2017 showed postsurgical changes of the ventral abdominal wall hernia repair with persistent laxity of the ventral abdominal wall.  Tiny fat-containing  umbilical hernia was also noted.  Close opposition of the small bowel to the ventral abdominal wall suggestive of adhesions.  She was seen for follow-up in April 2017 and at that time wanted to wait until August because she was starting a new job.  It appears that then she was lost to follow-up.  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  PAST MEDICAL HISTORY:  Past Medical History:  Diagnosis Date  . Abdominal discomfort   . Anemia   . Constipation   . Mixed hyperlipidemia   . Pelvic mass   . Umbilical hernia without obstruction or gangrene      PAST SURGICAL HISTORY:  Past Surgical History:  Procedure Laterality Date  . BREAST SURGERY  1991   lump removal  . CESAREAN SECTION  2008  . HERNIA REPAIR  2012   w/ mesh  . MYOMECTOMY ABDOMINAL APPROACH  2002    OB/GYN HISTORY:  OB History  Gravida Para Term Preterm AB Living  1 1           SAB TAB Ectopic Multiple Live Births               # Outcome Date GA Lbr Len/2nd Weight Sex Delivery Anes PTL Lv  1 Para             No LMP recorded.  Age at menarche: 35 Age at menopause: N/A Hx of HRT: N/A Hx of STDs: Denies Last pap: 01/19/20 -negative, high-risk HPV negative History of abnormal pap smears: possible remote history in her 40s  SCREENING STUDIES:  Last mammogram: Scheduled for 1 soon  Last colonoscopy: Has not had  MEDICATIONS: Outpatient Encounter Medications as of 01/23/2020  Medication Sig  . iron polysaccharides (FERREX 150) 150 MG capsule Take 150 mg by mouth daily.  . rosuvastatin (CRESTOR) 10 MG tablet Take 10 mg by mouth at bedtime.   No facility-administered encounter medications on file as of 01/23/2020.    ALLERGIES:  Allergies  Allergen Reactions  . Metronidazole Swelling and Other (See Comments)    syncope Passes out. syncope Passes out.   . Sulfa Antibiotics Hives     FAMILY HISTORY:  Family History  Problem Relation Age of Onset  . Hypertension Mother   . Cancer Other        Breast-other family member on maternal side  . Ovarian cancer Neg Hx   . Endometrial cancer Neg Hx   . Colon cancer Neg Hx      SOCIAL HISTORY:    Social Connections:   . Frequency of Communication with Friends and Family:   . Frequency of Social Gatherings with Friends and Family:   . Attends Religious Services:   . Active Member of Clubs or Organizations:   . Attends Archivist Meetings:   Marland Kitchen Marital Status:     REVIEW OF SYSTEMS:  Pertinent positives as per HPI Denies appetite changes, fevers, chills, fatigue, unexplained weight changes. Denies hearing loss, neck lumps or masses, mouth sores, ringing in ears or voice changes. Denies cough or wheezing.  Denies shortness of breath. Denies chest pain or palpitations. Denies leg swelling. Denies abdominal distention, blood in stools, diarrhea, nausea, vomiting. Denies pain with  intercourse, dysuria, frequency, hematuria or incontinence. Denies hot flashes, vaginal bleeding or vaginal discharge.   Denies joint pain, back pain or muscle pain/cramps. Denies itching, rash, or wounds. Denies dizziness, headaches, numbness or seizures. Denies swollen lymph nodes or glands, denies  easy bruising or bleeding. Denies anxiety, depression, confusion, or decreased concentration.  Physical Exam:  Vital Signs for this encounter:  Blood pressure (!) 161/89, pulse 80, temperature 98.5 F (36.9 C), temperature source Oral, resp. rate 16, height 5' 1.5" (1.562 m), weight 168 lb 6 oz (76.4 kg), SpO2 100 %. Body mass index is 31.3 kg/m. General: Alert, oriented, no acute distress.  HEENT: Normocephalic, atraumatic. Sclera anicteric.  Chest: Clear to auscultation bilaterally. No wheezes, rhonchi, or rales. Cardiovascular: Regular rate and rhythm, no murmurs, rubs, or gallops.  Abdomen: Obese. Normoactive bowel sounds. Soft, nondistended, nontender to palpation.  At least a several centimeter supraumbilical hernia that can be palpated.  There is significant abdominal laxity between the rectus muscles that spans at least 10 cm in the mid abdomen and above the umbilicus.  Multiple well-healed laparoscopic incisions as well as Pfannenstiel incisions.  No masses or hepatosplenomegaly appreciated. No palpable fluid wave.  Extremities: Grossly normal range of motion. Warm, well perfused. No edema bilaterally.  Skin: No rashes or lesions.  Lymphatics: No cervical, supraclavicular, or inguinal adenopathy.  GU:  Normal external female genitalia. No lesions. No discharge or bleeding.             Bladder/urethra:  No lesions or masses, well supported bladder             Vagina: Well rugated vaginal mucosa, no bleeding or discharge noted.             Cervix: Normal appearing, no lesions.             Uterus: 12-14 cm, minimally mobile, no parametrial involvement or nodularity.  4-6 cm anterior  fundal fibroid palpable.  Smooth mass high within the pelvis appreciated in the posterior cul-de-sac, better on rectovaginal exam.  This mass is difficult to move given its height out of the pelvis.             Adnexa: See above.  Rectal: No nodularity.  LABORATORY AND RADIOLOGIC DATA:  Outside medical records were reviewed to synthesize the above history, along with the history and physical obtained during the visit.   CT abdomen/pelvis on 8/5: 1.  Complex cystic left adnexal mass -mass measures 6.7 x 6 x 5.5 cm with internal septations.  Some of septations may be related to a dilated fallopian tube along the anterior medial aspect of the cyst.  Consider cystic ovarian neoplasm.  No evidence of metastatic disease.  Possible trace free fluid adjacent to the cyst. 2.  Small right adnexal cyst. 3.  Enlarged uterus with multiple fibroids. 4.  Diastases recti with ventral mesh that appears to be intact.  There is some eventration of the mesh at the umbilicus but no discrete hernia. 5.  Sigmoid diverticulosis without diverticulitis. 6.  Small sliding hiatal hernia. 7.  Small hepatic cyst with probable steatosis.  Pelvic ultrasound on 8/5: Uterus is enlarged measuring 14 x 6 x 7 cm with multiple intramural fibroids.  The myometrium is heterogenous.  Largest fibroid in the anterior corpus measures up to 5 cm.  Right ovary is normal in size measuring 3.6 x 1.3 x 2.7 cm.  Small right ovarian cyst measures up to 1.8 cm.  Complex cystic mass in the left adnexa measures 8.2 x 7 x 8.1 cm.  This appears to be contiguous with the ovary.  There is also other cystic areas which may represent fluid within the tube versus some irregular thickening of the septi.

## 2020-01-24 ENCOUNTER — Ambulatory Visit
Admission: RE | Admit: 2020-01-24 | Discharge: 2020-01-24 | Disposition: A | Discharge status: Home / Self Care | Payer: Self-pay | Source: Ambulatory Visit | Attending: Gynecologic Oncology | Admitting: Gynecologic Oncology

## 2020-01-24 ENCOUNTER — Other Ambulatory Visit: Payer: Self-pay

## 2020-01-24 DIAGNOSIS — N9489 Other specified conditions associated with female genital organs and menstrual cycle: Secondary | ICD-10-CM

## 2020-01-24 DIAGNOSIS — R109 Unspecified abdominal pain: Secondary | ICD-10-CM

## 2020-01-24 LAB — CA 125: Cancer Antigen (CA) 125: 30.1 U/mL (ref 0.0–38.1)

## 2020-01-26 ENCOUNTER — Telehealth: Payer: Self-pay | Admitting: *Deleted

## 2020-01-26 NOTE — Telephone Encounter (Signed)
Patient called and stated "Dr Berline Lopes wanted me to let her when Dr Arvin Collard would be working on scheduling me hernia repair surgery. His office is working on that now and said they will be reaching out to Dr [employer] office."

## 2020-01-30 ENCOUNTER — Telehealth: Payer: Self-pay | Admitting: *Deleted

## 2020-01-30 NOTE — Telephone Encounter (Signed)
Attempted to return the patient's call and left a message for her to call the office back

## 2020-02-07 ENCOUNTER — Telehealth: Payer: Self-pay | Admitting: Oncology

## 2020-02-07 NOTE — Telephone Encounter (Signed)
Left a message for patient and requested a return call regarding recommendations from her last visit with Dr. Arvin Collard.

## 2020-02-07 NOTE — Telephone Encounter (Signed)
Katelyn Lucas called back and said she had her phone visit with Dr. Arvin Collard on 8/18.  He only wants to repair the new hernia and doesn't want to participate with having her left adnexal mass accessed or removed.    She talked with her PCP, Dr. Rollene Rotunda last week who placed a referral [facility] with [facility] in [place].  She thinks they will be able to coordinate having a [place] surgeon present to access her adnexal mass during surgery.  She has not heard from them yet but will let us know when her appointment is scheduled and if she needs any assistance with coordination.

## 2020-04-24 ENCOUNTER — Encounter: Payer: Self-pay | Admitting: Gynecologic Oncology

## 2020-04-25 NOTE — Progress Notes (Signed)
Gynecologic Oncology Return Clinic Visit  04/26/20  Reason for Visit: f/u adnexal mass  Interval History: Katelyn Lucas had a visit with Dr. Milas Gain in [place] with a plan to get surgery scheduled to repair her hernia and replaced her mesh for either later this month or next month.  I am unable to see the note from this encounter, but per the patient's report, they have gotten authorization from her insurance to move forward with having the surgery scheduled.  They will also plan to have her pelvic mass evaluated and removed if needed at the time of surgery.  Overall she endorses feeling similar or improved compared to her visit with me in August.  He is trying to lose 10 pounds before surgery.  She endorses a good appetite.  She has nausea occasionally that seems to be related to her menstrual cycles and postnasal drip secondary to allergies.  The pain that she was having with her cycles that she described as sharp has resolved.  She now has some left-sided pelvic pain intermittently.  Her bowel function has improved significantly secondary to pay more attention to her diet and using MiraLAX as well as other medications to keep her bowel movements regular.  She had a mammogram in 02/2020.  Past Medical/Surgical History: Past Medical History:  Diagnosis Date  . Abdominal discomfort   . Anemia   . Constipation   . Mixed hyperlipidemia   . Pelvic mass   . Umbilical hernia without obstruction or gangrene     Past Surgical History:  Procedure Laterality Date  . BREAST SURGERY  1991   lump removal  . CESAREAN SECTION  2008  . HERNIA REPAIR  2012   w/ mesh  . MYOMECTOMY ABDOMINAL APPROACH  2002    Family History  Problem Relation Age of Onset  . Hypertension Mother   . Cancer Other        Breast-other family member on maternal side  . Ovarian cancer Neg Hx   . Endometrial cancer Neg Hx   . Colon cancer Neg Hx     Social History   Socioeconomic History  . Marital status: Single     Spouse name: Not on file  . Number of children: Not on file  . Years of education: Not on file  . Highest education level: Not on file  Occupational History  . Occupation: Ship broker  Tobacco Use  . Smoking status: Never Smoker  . Smokeless tobacco: Never Used  Vaping Use  . Vaping Use: Never used  Substance and Sexual Activity  . Alcohol use: Not Currently  . Drug use: Never  . Sexual activity: Not Currently  Other Topics Concern  . Not on file  Social History Narrative  . Not on file   Social Determinants of Health   Financial Resource Strain:   . Difficulty of Paying Living Expenses: Not on file  Food Insecurity:   . Worried About Charity fundraiser in the Last Year: Not on file  . Ran Out of Food in the Last Year: Not on file  Transportation Needs:   . Lack of Transportation (Medical): Not on file  . Lack of Transportation (Non-Medical): Not on file  Physical Activity:   . Days of Exercise per Week: Not on file  . Minutes of Exercise per Session: Not on file  Stress:   . Feeling of Stress : Not on file  Social Connections:   . Frequency of Communication with Friends and Family: Not on file  .  Frequency of Social Gatherings with Friends and Family: Not on file  . Attends Religious Services: Not on file  . Active Member of Clubs or Organizations: Not on file  . Attends Archivist Meetings: Not on file  . Marital Status: Not on file    Current Medications:  Current Outpatient Medications:  .  Cholecalciferol (VITAMIN D3) 50 MCG (2000 UT) TABS, Take 2,000 Units by mouth daily., Disp: , Rfl:  .  fluticasone (FLONASE) 50 MCG/ACT nasal spray, two sprays by Both Nostrils route daily., Disp: , Rfl:  .  iron polysaccharides (FERREX 150) 150 MG capsule, Take 150 mg by mouth daily., Disp: , Rfl:  .  Multiple Vitamin (MULTIVITAMIN) capsule, Take 1 capsule by mouth daily., Disp: , Rfl:  .  rosuvastatin (CRESTOR) 10 MG tablet, Take 10 mg by mouth at bedtime., Disp:  , Rfl:  .  cetirizine (ZYRTEC) 10 MG tablet, Take 10 mg by mouth daily., Disp: , Rfl:   Review of Systems: Denies appetite changes, fevers, chills, fatigue, unexplained weight changes. Denies hearing loss, neck lumps or masses, mouth sores, ringing in ears or voice changes. Denies cough or wheezing.  Denies shortness of breath. Denies chest pain or palpitations. Denies leg swelling. Denies abdominal distention, pain, blood in stools, constipation, diarrhea, nausea, vomiting, or early satiety. Denies pain with intercourse, dysuria, frequency, hematuria or incontinence. Denies hot flashes, pelvic pain, vaginal bleeding or vaginal discharge.   Denies joint pain, back pain or muscle pain/cramps. Denies itching, rash, or wounds. Denies dizziness, headaches, numbness or seizures. Denies swollen lymph nodes or glands, denies easy bruising or bleeding. Denies anxiety, depression, confusion, or decreased concentration.  Physical Exam: BP (!) 150/91 (BP Location: Left Arm, Patient Position: Sitting)   Pulse 89   Temp 97.6 F (36.4 C) (Tympanic)   Resp 18   Wt 164 lb (74.4 kg)   SpO2 100%   BMI 30.49 kg/m  General: Alert, oriented, no acute distress. HEENT: Normocephalic, atraumatic, sclera anicteric. Chest: Labored breathing on room air. Skin: No rashes or lesions noted.  Laboratory & Radiologic Studies: None new  Assessment & Plan: Katelyn Lucas is a 49 y.o. woman with a complex surgical history and a complex adnexal mass found on imaging earlier this year.  From a symptom standpoint, the patient has overall improved since her last visit.  She continues to endorse some abdominal symptoms including bloating and pain that I think are more likely related to her hernia than the adnexal mass.  She has now established with a new general surgeon and hopefully will be undergoing surgery to repair her hernia in the next 1-2 months.  Ideally, her adnexal mass would be evaluated  Intra-Op and removed if concerning in appearance.  Given her age, it would not be unreasonable to perform a unilateral salpingo-oophorectomy.  The patient has not had time to schedule her repeat ultrasound.  I offered that we could get this scheduled here at [facility] if it would be easier for her.  We had previously discussed having it done at the same facility for better comparison.  Given the fact that she is going to have surgery now somewhere else, it may be helpful to have the report within our system.  She was interested in having the ultrasound scheduled here at and this will be done in the next couple of weeks.  Depending on the findings at the repeat ultrasound, we will discuss whether repeat tumor markers are needed.  At this time I  do not feel strongly that they would be of benefit.  35 minutes of total time was spent for this patient encounter, including preparation, face-to-face counseling with the patient and coordination of care, and documentation of the encounter.  Jeral Pinch, MD  Division of Gynecologic Oncology  Department of Obstetrics and Gynecology  [facility]

## 2020-04-26 ENCOUNTER — Other Ambulatory Visit: Payer: Self-pay

## 2020-04-26 ENCOUNTER — Inpatient Hospital Stay: Payer: Medicaid Other | Attending: Gynecologic Oncology | Admitting: Gynecologic Oncology

## 2020-04-26 VITALS — BP 150/91 | HR 89 | Temp 97.6°F | Resp 18 | Wt 164.0 lb

## 2020-04-26 DIAGNOSIS — R110 Nausea: Secondary | ICD-10-CM | POA: Diagnosis not present

## 2020-04-26 DIAGNOSIS — E782 Mixed hyperlipidemia: Secondary | ICD-10-CM | POA: Diagnosis not present

## 2020-04-26 DIAGNOSIS — Z79899 Other long term (current) drug therapy: Secondary | ICD-10-CM | POA: Diagnosis not present

## 2020-04-26 DIAGNOSIS — R0982 Postnasal drip: Secondary | ICD-10-CM | POA: Insufficient documentation

## 2020-04-26 DIAGNOSIS — K469 Unspecified abdominal hernia without obstruction or gangrene: Secondary | ICD-10-CM | POA: Diagnosis not present

## 2020-04-26 DIAGNOSIS — K5900 Constipation, unspecified: Secondary | ICD-10-CM | POA: Diagnosis not present

## 2020-04-26 DIAGNOSIS — D398 Neoplasm of uncertain behavior of other specified female genital organs: Secondary | ICD-10-CM | POA: Diagnosis present

## 2020-04-26 DIAGNOSIS — N9489 Other specified conditions associated with female genital organs and menstrual cycle: Secondary | ICD-10-CM

## 2020-04-26 NOTE — Patient Instructions (Signed)
I will call you with your ultrasound results once we have them. We will hold off on repeating the blood work we got last time until I see if there has been any change to that ovarian cyst.  Please let me know if you need anything before or after your surgery in [place].

## 2020-04-29 ENCOUNTER — Telehealth: Payer: Self-pay | Admitting: Oncology

## 2020-04-29 NOTE — Telephone Encounter (Signed)
Norina left a message that her surgery has been scheduled for 05/30/20 and wanted to let Dr. Berline Lopes know.

## 2020-05-02 ENCOUNTER — Other Ambulatory Visit: Payer: Self-pay

## 2020-05-02 ENCOUNTER — Ambulatory Visit (HOSPITAL_COMMUNITY)
Admission: RE | Admit: 2020-05-02 | Discharge: 2020-05-02 | Disposition: A | Discharge status: Home / Self Care | Payer: Medicaid Other | Source: Ambulatory Visit | Attending: Gynecologic Oncology | Admitting: Gynecologic Oncology

## 2020-05-02 DIAGNOSIS — N9489 Other specified conditions associated with female genital organs and menstrual cycle: Secondary | ICD-10-CM | POA: Insufficient documentation

## 2020-05-07 ENCOUNTER — Telehealth: Payer: Self-pay | Admitting: Gynecologic Oncology

## 2020-05-07 ENCOUNTER — Encounter: Payer: Self-pay | Admitting: Gynecologic Oncology

## 2020-05-07 NOTE — Telephone Encounter (Signed)
I called the patient yesterday to discuss ultrasound findings.  I do not feel that MRI needs to be done before her surgery, but if anyone at the facility where she is having her hernia surgery done feel strongly about it, I am happy to help facilitate getting an MRI scheduled.  I reached out to the patient's general surgeon with whom she is scheduled for surgery in mid December.  I left a message with the office and gave my cell phone number for her to contact me if any questions.  Jeral Pinch MD Gynecologic Oncology

## 2020-05-15 ENCOUNTER — Encounter: Payer: Self-pay | Admitting: Gynecologic Oncology

## 2020-06-26 ENCOUNTER — Encounter: Payer: Self-pay | Admitting: Gynecologic Oncology

## 2021-04-03 IMAGING — US US PELVIS COMPLETE WITH TRANSVAGINAL
1 series · 13 of 25 positions shown · non-contrast
Comparison: Prior outside CT and radiology reports from 01/18/2020.

CLINICAL DATA: Follow-up examination for adnexal mass.



[Series 1: us pelvis complete with transvaginal · 13 of 60 slices shown]
[im 1/60]
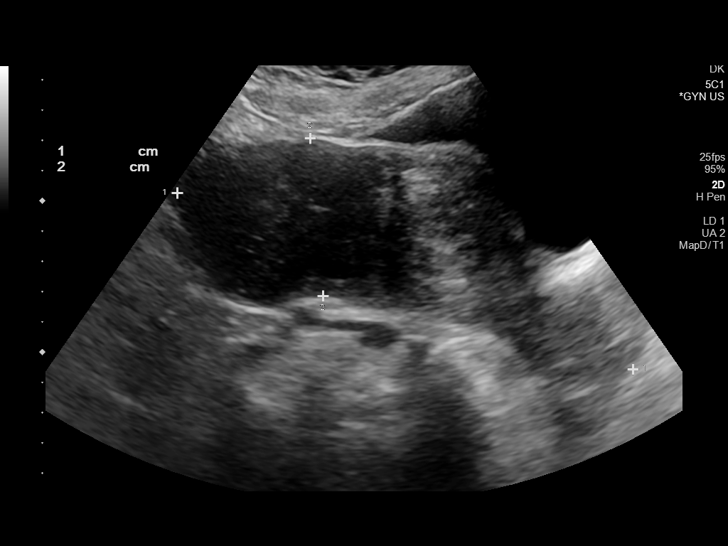
[im 5/60]
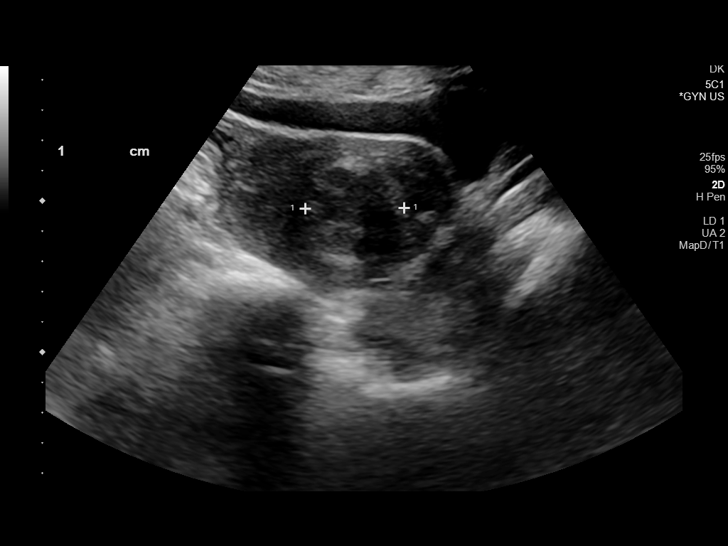
[im 10/60]
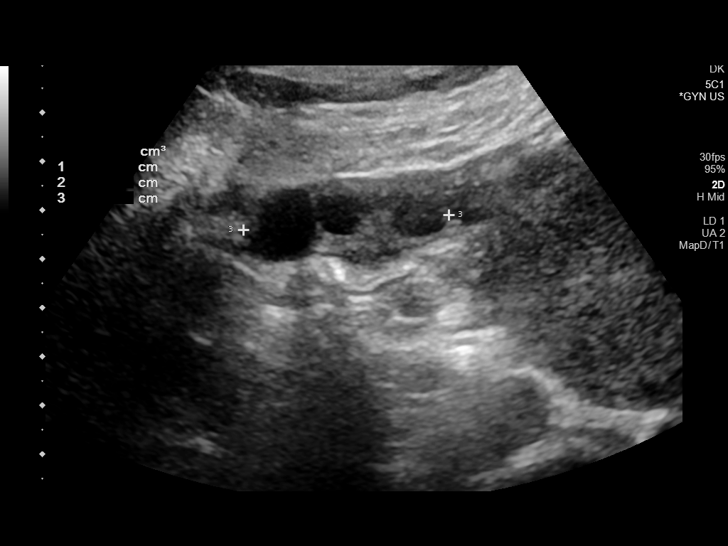
[im 15/60]
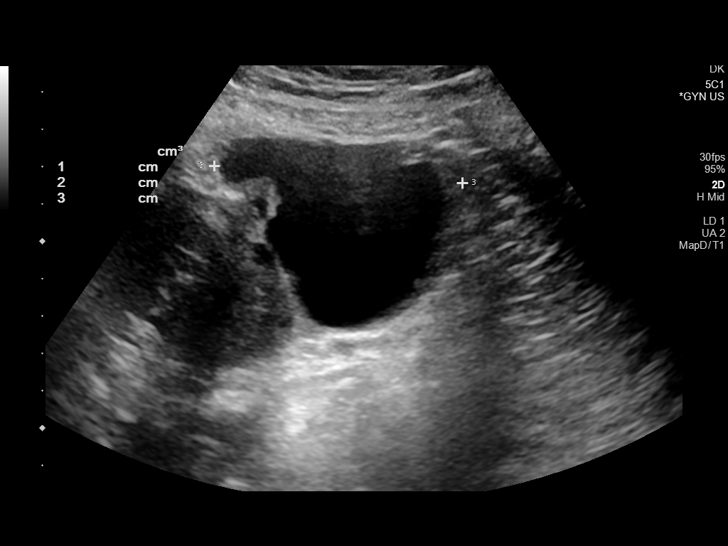
[im 20/60]
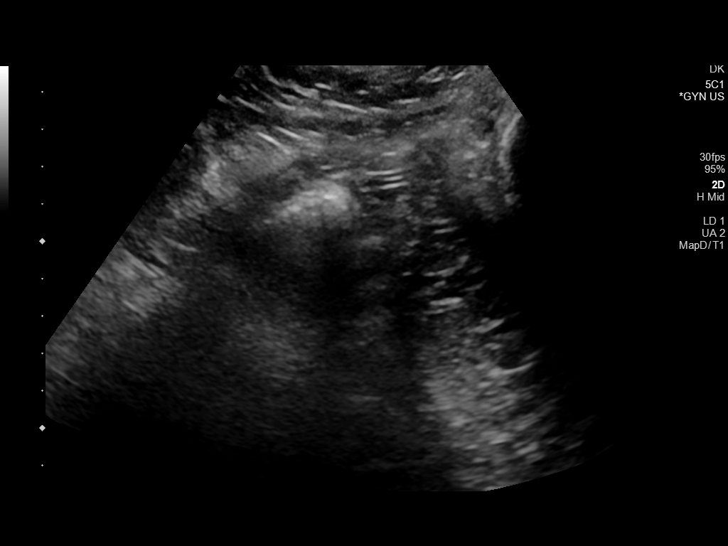
[im 25/60]
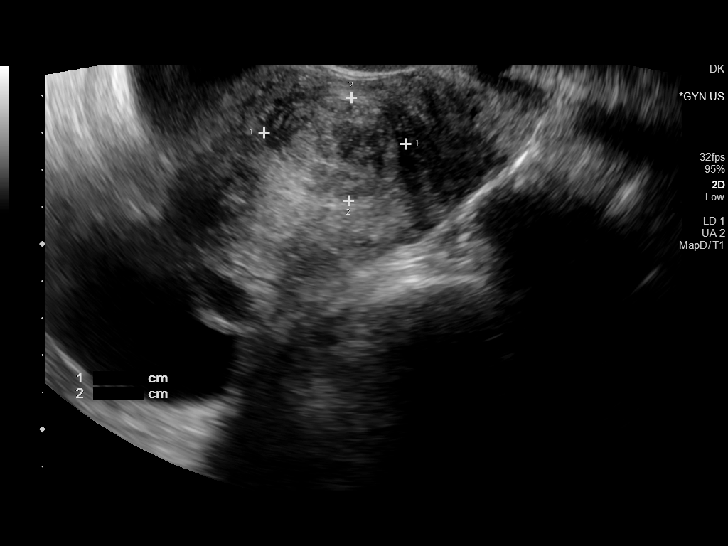
[im 30/60]
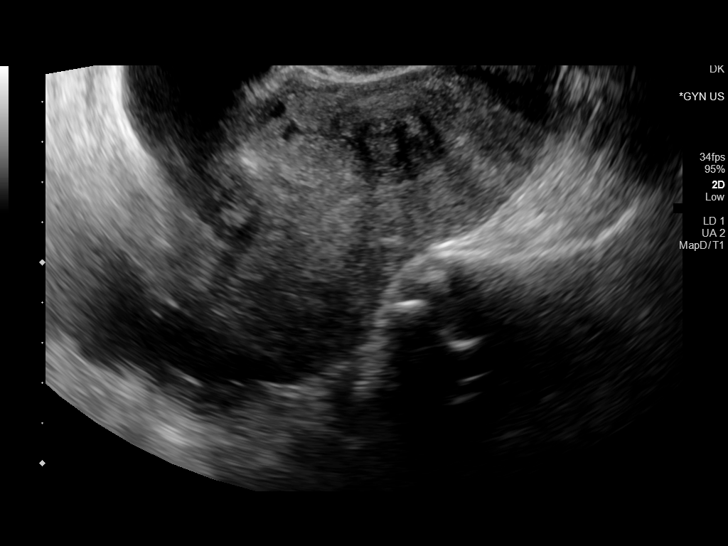
[im 35/60]
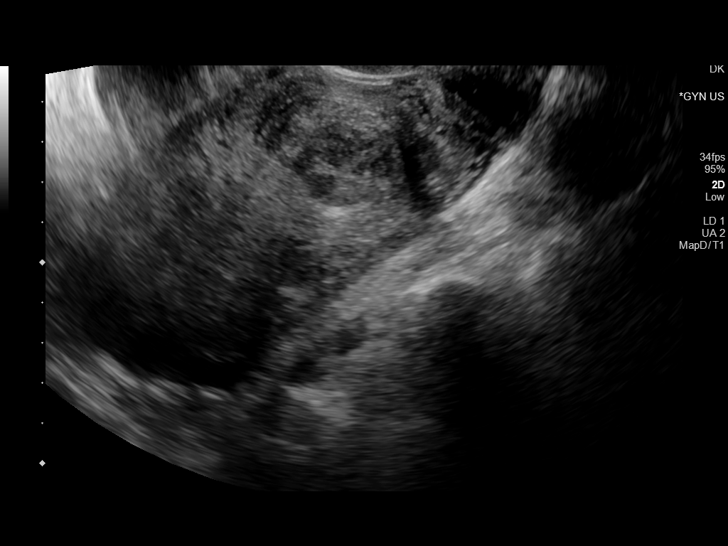
[im 40/60]
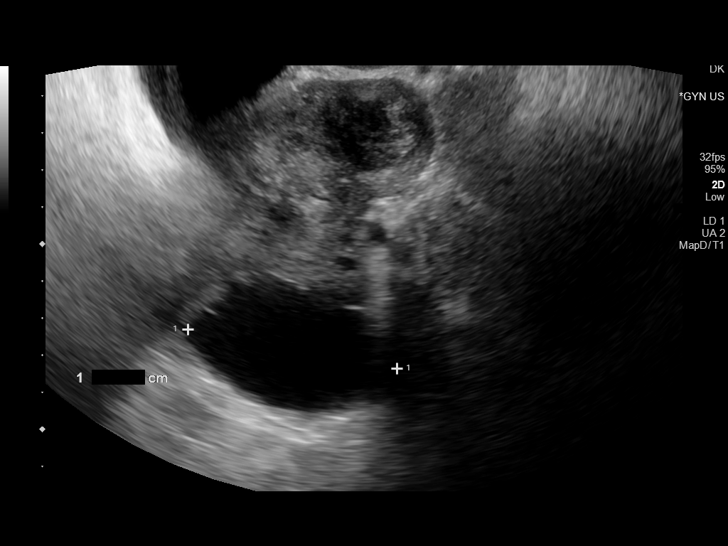
[im 45/60]
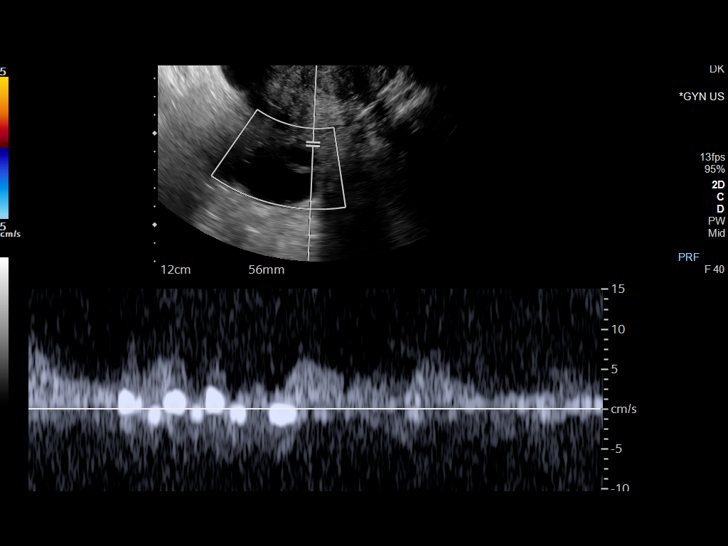
[im 50/60]
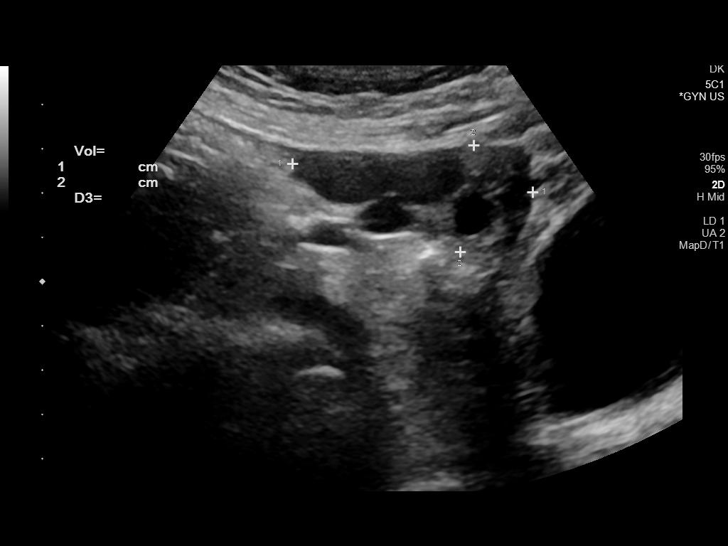
[im 55/60]
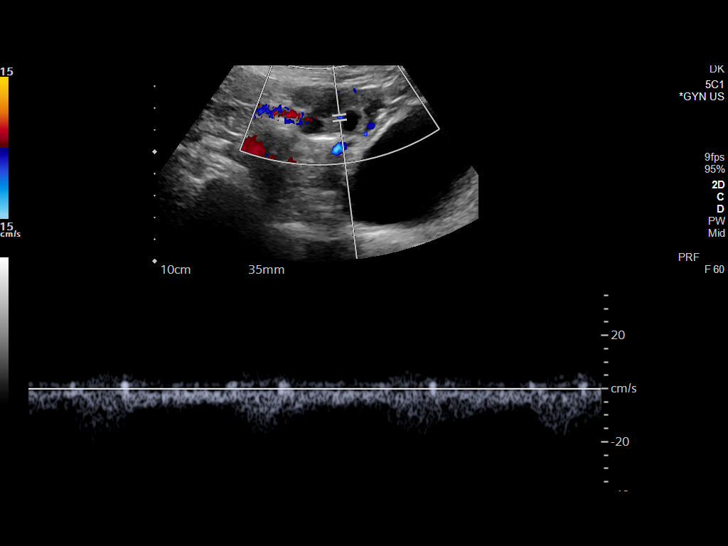
[im 60/60]
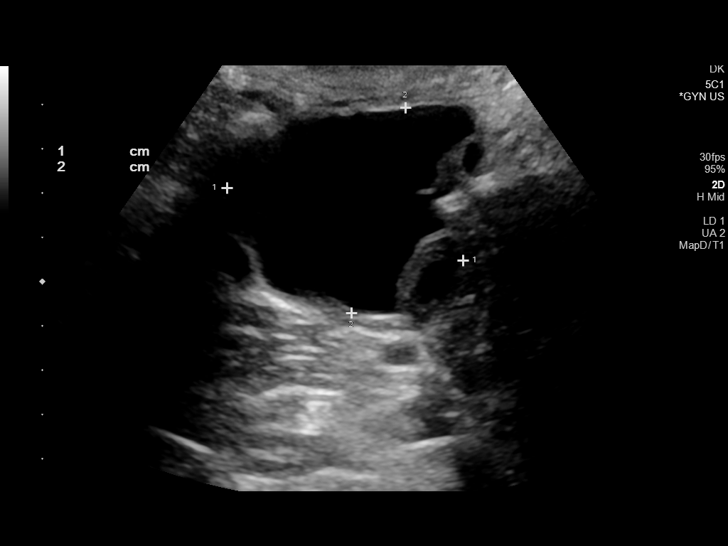

[13 of 25 positions shown; findings below may reference images not displayed]

FINDINGS: Uterus

Measurements: 16.2 x 5.2 x 7.3 cm = volume: 177.7 mL. Uterus is
anteverted. Heterogeneous echotexture seen throughout the uterine
myometrium. Multiple scattered uterine fibroids again seen. Largest
measured fibroid on this exam seen at the left anterior body and
measures 3.8 x 2.8 x 3.5 cm

Endometrium

Endometrial complex not well visualized due to overlying fibroids.

Right ovary

Measurements: 5.5 x 2.4 x 2.1 cm = volume: 14.9 mL. 1.7 cm simple
cyst, similar to previous. No other adnexal mass.

Left ovary

Measurements: 6.8 x 5.2 x 6.7 cm = volume: 121.8 mL. Again seen is a
large cystic mass within the left adnexa, likely arising from the
left ovary. Lesion measures 6.0 x 5.6 x 4.8 cm on today's exam,
previously 6.7 x 6.0 x 5.5 cm on prior CT. Lesion is complex in
appearance with scattered internal cystic components separated by
somewhat thickened internal septations (image 58). Mural based soft
tissue component along the periphery of this lesion could reflect
ovarian tissue versus a solid component. Some internal vascularity
is seen within this mural based component.

Other findings

No free fluid seen within the pelvis.
IMPRESSION: 1. 6.0 x 5.6 x 4.8 cm complex left adnexal cystic mass, not
significantly changed as compared to prior exams from 01/18/2020.
While this finding is indeterminate, a possible cystic ovarian
neoplasm could have this appearance. Follow-up examination with
dedicated pelvic MRI, with and without contrast, could be performed
for further evaluation as clinically warranted.
2. Enlarged fibroid uterus.
3. Endometrial stripe not well seen on today's exam due to overlying
fibroids.
4. 1.7 simple right ovarian cyst, not significantly changed from
previous.
